# Patient Record
Sex: Female | Born: 1990 | Race: Black or African American | Hispanic: No | Marital: Single | Smoking: Never smoker
Health system: Southern US, Community
[De-identification: ages and names within clinical notes are randomized; demographics above are authoritative.]

## PROBLEM LIST (undated history)

## (undated) DIAGNOSIS — T7840XA Allergy, unspecified, initial encounter: Secondary | ICD-10-CM

## (undated) DIAGNOSIS — D649 Anemia, unspecified: Secondary | ICD-10-CM

## (undated) HISTORY — DX: Anemia, unspecified: D64.9

## (undated) HISTORY — DX: Allergy, unspecified, initial encounter: T78.40XA

---

## 2015-04-19 ENCOUNTER — Ambulatory Visit (INDEPENDENT_AMBULATORY_CARE_PROVIDER_SITE_OTHER): Payer: Managed Care, Other (non HMO) | Admitting: Family Medicine

## 2015-04-19 VITALS — BP 118/74 | HR 74 | Temp 98.4°F | Resp 14 | Ht 70.5 in | Wt 252.0 lb

## 2015-04-19 DIAGNOSIS — R42 Dizziness and giddiness: Secondary | ICD-10-CM | POA: Diagnosis not present

## 2015-04-19 DIAGNOSIS — Z3009 Encounter for other general counseling and advice on contraception: Secondary | ICD-10-CM | POA: Diagnosis not present

## 2015-04-19 DIAGNOSIS — G43111 Migraine with aura, intractable, with status migrainosus: Secondary | ICD-10-CM

## 2015-04-19 LAB — POCT CBC
GRANULOCYTE PERCENT: 49.3 % (ref 37–80)
HEMATOCRIT: 37 % — AB (ref 37.7–47.9)
Hemoglobin: 12.7 g/dL (ref 12.2–16.2)
Lymph, poc: 2.8 (ref 0.6–3.4)
MCH: 28.3 pg (ref 27–31.2)
MCHC: 34.3 g/dL (ref 31.8–35.4)
MCV: 82.6 fL (ref 80–97)
MID (cbc): 0.2 (ref 0–0.9)
MPV: 9.2 fL (ref 0–99.8)
PLATELET COUNT, POC: 240 10*3/uL (ref 142–424)
POC Granulocyte: 2.9 (ref 2–6.9)
POC LYMPH %: 47.1 % (ref 10–50)
POC MID %: 3.6 %M (ref 0–12)
RBC: 4.48 M/uL (ref 4.04–5.48)
RDW, POC: 13 %
WBC: 5.9 10*3/uL (ref 4.6–10.2)

## 2015-04-19 LAB — POCT URINALYSIS DIP (MANUAL ENTRY)
BILIRUBIN UA: NEGATIVE
BILIRUBIN UA: NEGATIVE
Blood, UA: NEGATIVE
Glucose, UA: NEGATIVE
LEUKOCYTES UA: NEGATIVE
Nitrite, UA: NEGATIVE
PH UA: 5.5
PROTEIN UA: NEGATIVE
SPEC GRAV UA: 1.02
Urobilinogen, UA: 0.2

## 2015-04-19 LAB — POC MICROSCOPIC URINALYSIS (UMFC)

## 2015-04-19 LAB — POCT URINE PREGNANCY: PREG TEST UR: NEGATIVE

## 2015-04-19 LAB — GLUCOSE, POCT (MANUAL RESULT ENTRY): POC Glucose: 114 mg/dl — AB (ref 70–99)

## 2015-04-19 MED ORDER — IBUPROFEN 800 MG PO TABS: 800.0000 mg | ORAL_TABLET | Freq: Three times a day (TID) | ORAL | Status: DC | PRN

## 2015-04-19 MED ORDER — AMITRIPTYLINE HCL 10 MG PO TABS: 10.0000 mg | ORAL_TABLET | Freq: Every day | ORAL | Status: DC

## 2015-04-19 MED ORDER — SUMATRIPTAN SUCCINATE 100 MG PO TABS: 100.0000 mg | ORAL_TABLET | ORAL | Status: DC | PRN

## 2015-04-19 NOTE — Patient Instructions (Addendum)
   IF you received an x-ray today, you will receive an invoice from [facility]. Please contact [facility] at [phone] with questions or concerns regarding your invoice.   IF you received labwork today, you will receive an invoice from [facility]. Please contact [facility] at [phone] with questions or concerns regarding your invoice.   Our billing staff will not be able to assist you with questions regarding bills from these companies.  You will be contacted with the lab results as soon as they are available. The fastest way to get your results is to activate your My Chart account. Instructions are located on the last page of this paperwork. If you have not heard from us regarding the results in 2 weeks, please contact this office.      Migraine Headache A migraine headache is an intense, throbbing pain on one or both sides of your head. A migraine can last for 30 minutes to several hours. CAUSES  The exact cause of a migraine headache is not always known. However, a migraine may be caused when nerves in the brain become irritated and release chemicals that cause inflammation. This causes pain. Certain things may also trigger migraines, such as:  Alcohol.  Smoking.  Stress.  Menstruation.  Aged cheeses.  Foods or drinks that contain nitrates, glutamate, aspartame, or tyramine.  Lack of sleep.  Chocolate.  Caffeine.  Hunger.  Physical exertion.  Fatigue.  Medicines used to treat chest pain (nitroglycerine), birth control pills, estrogen, and some blood pressure medicines. SIGNS AND SYMPTOMS  Pain on one or both sides of your head.  Pulsating or throbbing pain.  Severe pain that prevents daily activities.  Pain that is aggravated by any physical activity.  Nausea, vomiting, or both.  Dizziness.  Pain with exposure to bright lights, loud noises, or activity.  General sensitivity to bright lights, loud  noises, or smells. Before you get a migraine, you may get warning signs that a migraine is coming (aura). An aura may include:  Seeing flashing lights.  Seeing bright spots, halos, or zigzag lines.  Having tunnel vision or blurred vision.  Having feelings of numbness or tingling.  Having trouble talking.  Having muscle weakness. DIAGNOSIS  A migraine headache is often diagnosed based on:  Symptoms.  Physical exam.  A CT scan or MRI of your head. These imaging tests cannot diagnose migraines, but they can help rule out other causes of headaches. TREATMENT Medicines may be given for pain and nausea. Medicines can also be given to help prevent recurrent migraines.  HOME CARE INSTRUCTIONS  Only take over-the-counter or prescription medicines for pain or discomfort as directed by your health care provider. The use of long-term narcotics is not recommended.  Lie down in a dark, quiet room when you have a migraine.  Keep a journal to find out what may trigger your migraine headaches. For example, write down:  What you eat and drink.  How much sleep you get.  Any change to your diet or medicines.  Limit alcohol consumption.  Quit smoking if you smoke.  Get 7-9 hours of sleep, or as recommended by your health care provider.  Limit stress.  Keep lights dim if bright lights bother you and make your migraines worse. SEEK IMMEDIATE MEDICAL CARE IF:   Your migraine becomes severe.  You have a fever.  You have a stiff neck.  You have vision loss.  You have muscular weakness or loss of muscle control.  You   start losing your balance or have trouble walking.  You feel faint or pass out.  You have severe symptoms that are different from your first symptoms. MAKE SURE YOU:   Understand these instructions.  Will watch your condition.  Will get help right away if you are not doing well or get worse.   This information is not intended to replace advice given to you by  your health care provider. Make sure you discuss any questions you have with your health care provider.   Document Released: 12/27/2004 Document Revised: 01/17/2014 Document Reviewed: 09/03/2012 Elsevier Interactive Patient Education 2016 Elsevier Inc.  

## 2015-04-19 NOTE — Progress Notes (Signed)
Subjective:    Patient ID: Maria Contreras, female    DOB: 09/14/1990, 25 y.o.   MRN: [id number]  04/19/2015  Headache; Nausea; and Dizziness   HPI This 25 y.o. female presents for evaluation of headaches.  Suffers with a lot of headaches that come with nausea and dizziness.  Increase in frequency; onset two months ago. Last night got annoying. Excedrin extra strength without improvement. Tries to sleep off without improvement. Drinks plenty of water; does not eat badly a lot.  Tries to eat protein, fruits, vegetables.  No new foods.  L periorbital region; can be R periorbital.  +nauseated; +photophobia.  No phonophobia yet turn down music in car yesterday.  +diziness with headaches sometimes.  Does a lot of squatting at work; got really dizzy at work; suffering with headache at same time.  No vomiting.  +Brief blurred vision once.  No diplopia.  Does have a weird smell or bad taste in mouth before onset of headache.  No n/t/w.  No nighttime awakening.  Major stress lately but not as severe as in the past.  Sleeping well; no insomnia; 6-7 hours of sleep per night.  Schedule has changed at work; was working 6:00pm to 2:30am; now working 9:30pm to 4:00am.  Mother with migraines.  Did have headache today; was nauseated before arrival; now all resolved; no medications.  Having daily headaches for the past month. Last month, much less frequently.  Duration several hours.   LMP regular; LMP 04-02-15 regular; in February 1-5.  Trying to conceive.    PCP: none  Review of Systems  Constitutional: Negative for fever, chills, diaphoresis and fatigue.  HENT: Negative for congestion, ear pain, rhinorrhea and sore throat.   Eyes: Positive for photophobia. Negative for pain, discharge, itching and visual disturbance.  Respiratory: Negative for cough and shortness of breath.   Cardiovascular: Negative for chest pain, palpitations and leg swelling.  Gastrointestinal: Positive for nausea. Negative for vomiting,  abdominal pain, diarrhea and constipation.  Endocrine: Negative for cold intolerance, heat intolerance, polydipsia, polyphagia and polyuria.  Genitourinary: Positive for menstrual problem.  Neurological: Positive for dizziness and headaches. Negative for tremors, seizures, syncope, facial asymmetry, speech difficulty, weakness, light-headedness and numbness.  Psychiatric/Behavioral: Negative for sleep disturbance and dysphoric mood. The patient is not nervous/anxious.     Past Medical History  Diagnosis Date  . Anemia   . Allergy     Claritin   History reviewed. No pertinent past surgical history. No Known Allergies  Social History   Social History  . Marital Status: Single    Spouse Name: N/A  . Number of Children: N/A  . Years of Education: N/A   Occupational History  . Not on file.   Social History Main Topics  . Smoking status: Never Smoker   . Smokeless tobacco: Not on file  . Alcohol Use: Not on file  . Drug Use: Not on file  . Sexual Activity: Not on file   Other Topics Concern  . Not on file   Social History Narrative   Marital status: single      Children: none      Lives: alone      Employment: works for [employer]      Education: Manufacturing engineer in Engineer, manufacturing      Tobacco: none      Alcohol: 1 glass wine per week      Drugs: none      Exercise: crunches, walking dog at park  3 days per week.   Family History  Problem Relation Age of Onset  . Cancer Maternal Grandmother   . Diabetes Maternal Grandmother   . Hyperlipidemia Maternal Grandmother   . Hypertension Maternal Grandmother   . Migraines Mother   . Hypertension Mother        Objective:    BP 118/74 mmHg  Pulse 74  Temp(Src) 98.4 F (36.9 C) (Oral)  Resp 14  Ht 5' 10.5" (1.791 m)  Wt 252 lb (114.306 kg)  BMI 35.64 kg/m2  SpO2 98%  LMP 04/07/2015 Physical Exam  Constitutional: She is oriented to person, place, and time. She appears well-developed and  well-nourished. No distress.  HENT:  Head: Normocephalic and atraumatic.  Right Ear: External ear normal.  Left Ear: External ear normal.  Nose: Nose normal.  Mouth/Throat: Oropharynx is clear and moist.  Eyes: Conjunctivae and EOM are normal. Pupils are equal, round, and reactive to light.  Neck: Normal range of motion. Neck supple. Carotid bruit is not present. No thyromegaly present.  Cardiovascular: Normal rate, regular rhythm, normal heart sounds and intact distal pulses.  Exam reveals no gallop and no friction rub.   No murmur heard. Pulmonary/Chest: Effort normal and breath sounds normal. She has no wheezes. She has no rales.  Abdominal: Soft. Bowel sounds are normal. She exhibits no distension and no mass. There is no tenderness. There is no rebound and no guarding.  Lymphadenopathy:    She has no cervical adenopathy.  Neurological: She is alert and oriented to person, place, and time. She has normal reflexes. She displays no atrophy and no tremor. No cranial nerve deficit. She exhibits normal muscle tone. She displays a negative Romberg sign. Coordination and gait normal.  Skin: Skin is warm and dry. No rash noted. She is not diaphoretic. No erythema. No pallor.  Psychiatric: She has a normal mood and affect. Her behavior is normal. Judgment and thought content normal.   Results for orders placed or performed in visit on 04/19/15  POCT CBC  Result Value Ref Range   WBC 5.9 4.6 - 10.2 K/uL   Lymph, poc 2.8 0.6 - 3.4   POC LYMPH PERCENT 47.1 10 - 50 %L   MID (cbc) 0.2 0 - 0.9   POC MID % 3.6 0 - 12 %M   POC Granulocyte 2.9 2 - 6.9   Granulocyte percent 49.3 37 - 80 %G   RBC 4.48 4.04 - 5.48 M/uL   Hemoglobin 12.7 12.2 - 16.2 g/dL   HCT, POC 16.1 (A) 09.6 - 47.9 %   MCV 82.6 80 - 97 fL   MCH, POC 28.3 27 - 31.2 pg   MCHC 34.3 31.8 - 35.4 g/dL   RDW, POC 04.5 %   Platelet Count, POC 240 142 - 424 K/uL   MPV 9.2 0 - 99.8 fL  POCT glucose (manual entry)  Result Value Ref  Range   POC Glucose 114 (A) 70 - 99 mg/dl  POCT urinalysis dipstick  Result Value Ref Range   Color, UA yellow yellow   Clarity, UA clear clear   Glucose, UA negative negative   Bilirubin, UA negative negative   Ketones, POC UA negative negative   Spec Grav, UA 1.020    Blood, UA negative negative   pH, UA 5.5    Protein Ur, POC negative negative   Urobilinogen, UA 0.2    Nitrite, UA Negative Negative   Leukocytes, UA Negative Negative  POCT Microscopic Urinalysis ([facility])  Result  Value Ref Range   WBC,UR,HPF,POC None None WBC/hpf   RBC,UR,HPF,POC None None RBC/hpf   Bacteria Few (A) None, Too numerous to count   Mucus Present (A) Absent   Epithelial Cells, UR Per Microscopy Moderate (A) None, Too numerous to count cells/hpf  POCT urine pregnancy  Result Value Ref Range   Preg Test, Ur Negative Negative       Assessment & Plan:   1. Intractable migraine with aura with status migrainosus   2. Family planning counseling   3. Dizziness    -New. -Rx for Amitriptyline 10-20mg  qhs for prevention. (Class C) -rx for Imitrex and Ibuprofen 800mg  provided for rescue. (Class C meds) -when becomes pregnant, stop migraine medications and contact OB/GYN for further instructions. -PNV daily while attempting pregnancy. -normal neurological exam in office.   Orders Placed This Encounter  Procedures  . Comprehensive metabolic panel  . TSH  . Vitamin B12  . VITAMIN D 25 Hydroxy (Vit-D Deficiency, Fractures)  . POCT CBC  . POCT glucose (manual entry)  . POCT urinalysis dipstick  . POCT Microscopic Urinalysis ([facility])  . POCT urine pregnancy   Meds ordered this encounter  Medications  . SUMAtriptan (IMITREX) 100 MG tablet    Sig: Take 1 tablet (100 mg total) by mouth every 2 (two) hours as needed for migraine. May repeat in 2 hours if headache persists or recurs.    Dispense:  8 tablet    Refill:  4  . ibuprofen (ADVIL,MOTRIN) 800 MG tablet    Sig: Take 1 tablet (800 mg total) by  mouth every 8 (eight) hours as needed.    Dispense:  30 tablet    Refill:  3  . amitriptyline (ELAVIL) 10 MG tablet    Sig: Take 1-2 tablets (10-20 mg total) by mouth at bedtime.    Dispense:  60 tablet    Refill:  3    No Follow-up on file.    [place] Paulita FujitaMartin Kateleen Encarnacion, M.D. [facility]  [place] [address] [place], [place]  [id number] [phone] phone [phone] fax

## 2015-04-20 ENCOUNTER — Encounter (HOSPITAL_BASED_OUTPATIENT_CLINIC_OR_DEPARTMENT_OTHER): Payer: Self-pay | Admitting: *Deleted

## 2015-04-20 ENCOUNTER — Emergency Department (HOSPITAL_BASED_OUTPATIENT_CLINIC_OR_DEPARTMENT_OTHER)
Admission: EM | Admit: 2015-04-20 | Discharge: 2015-04-20 | Disposition: A | Discharge status: Home / Self Care | Payer: No Typology Code available for payment source | Attending: Emergency Medicine | Admitting: Emergency Medicine

## 2015-04-20 ENCOUNTER — Emergency Department (HOSPITAL_BASED_OUTPATIENT_CLINIC_OR_DEPARTMENT_OTHER): Payer: No Typology Code available for payment source

## 2015-04-20 DIAGNOSIS — S29002A Unspecified injury of muscle and tendon of back wall of thorax, initial encounter: Secondary | ICD-10-CM | POA: Diagnosis present

## 2015-04-20 DIAGNOSIS — Y92410 Unspecified street and highway as the place of occurrence of the external cause: Secondary | ICD-10-CM | POA: Insufficient documentation

## 2015-04-20 DIAGNOSIS — Z862 Personal history of diseases of the blood and blood-forming organs and certain disorders involving the immune mechanism: Secondary | ICD-10-CM | POA: Insufficient documentation

## 2015-04-20 DIAGNOSIS — S4991XA Unspecified injury of right shoulder and upper arm, initial encounter: Secondary | ICD-10-CM | POA: Insufficient documentation

## 2015-04-20 DIAGNOSIS — S4992XA Unspecified injury of left shoulder and upper arm, initial encounter: Secondary | ICD-10-CM | POA: Diagnosis not present

## 2015-04-20 DIAGNOSIS — Y9389 Activity, other specified: Secondary | ICD-10-CM | POA: Insufficient documentation

## 2015-04-20 DIAGNOSIS — M549 Dorsalgia, unspecified: Secondary | ICD-10-CM

## 2015-04-20 DIAGNOSIS — Y998 Other external cause status: Secondary | ICD-10-CM | POA: Diagnosis not present

## 2015-04-20 LAB — COMPREHENSIVE METABOLIC PANEL
ALBUMIN: 4.3 g/dL (ref 3.6–5.1)
ALK PHOS: 47 U/L (ref 33–115)
ALT: 15 U/L (ref 6–29)
AST: 13 U/L (ref 10–30)
BILIRUBIN TOTAL: 0.5 mg/dL (ref 0.2–1.2)
BUN: 9 mg/dL (ref 7–25)
CALCIUM: 9.7 mg/dL (ref 8.6–10.2)
CO2: 24 mmol/L (ref 20–31)
Chloride: 102 mmol/L (ref 98–110)
Creat: 0.66 mg/dL (ref 0.50–1.10)
GLUCOSE: 101 mg/dL — AB (ref 65–99)
POTASSIUM: 4.4 mmol/L (ref 3.5–5.3)
Sodium: 135 mmol/L (ref 135–146)
TOTAL PROTEIN: 7.7 g/dL (ref 6.1–8.1)

## 2015-04-20 LAB — TSH: TSH: 1.36 m[IU]/L

## 2015-04-20 LAB — VITAMIN B12: VITAMIN B 12: 636 pg/mL (ref 200–1100)

## 2015-04-20 MED ORDER — NAPROXEN 250 MG PO TABS
500.0000 mg | ORAL_TABLET | Freq: Once | ORAL | Status: AC
  Administered 2015-04-20: 500 mg via ORAL
  Filled 2015-04-20: qty 2

## 2015-04-20 MED ORDER — NAPROXEN 500 MG PO TABS: 500.0000 mg | ORAL_TABLET | Freq: Two times a day (BID) | ORAL | Status: DC | PRN

## 2015-04-20 MED ORDER — METHOCARBAMOL 500 MG PO TABS: 500.0000 mg | ORAL_TABLET | Freq: Two times a day (BID) | ORAL | Status: DC | PRN

## 2015-04-20 MED FILL — NAPROXEN 500 MG TABLET: 500 | 10 days supply | Qty: 20 | Fill #0

## 2015-04-20 MED FILL — METHOCARBAMOL 500 MG TABLET: 500 | 2 days supply | Qty: 4 | Fill #0

## 2015-04-20 NOTE — ED Notes (Signed)
Pt directed to pharmacy to pick up medications. Ambulatory with steady gait at d/c

## 2015-04-20 NOTE — ED Notes (Signed)
PA at bedside.
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
## 2015-04-20 NOTE — ED Notes (Signed)
Patient transported to X-ray and returned 
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
## 2015-04-20 NOTE — ED Notes (Signed)
MD at bedside. 
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
## 2015-04-21 LAB — VITAMIN D 25 HYDROXY (VIT D DEFICIENCY, FRACTURES): VIT D 25 HYDROXY: 24 ng/mL — AB (ref 30–100)

## 2015-04-23 ENCOUNTER — Encounter: Payer: Self-pay | Admitting: Family Medicine

## 2015-06-02 ENCOUNTER — Encounter (HOSPITAL_BASED_OUTPATIENT_CLINIC_OR_DEPARTMENT_OTHER): Payer: Self-pay | Admitting: Emergency Medicine

## 2015-06-02 ENCOUNTER — Emergency Department (HOSPITAL_BASED_OUTPATIENT_CLINIC_OR_DEPARTMENT_OTHER)
Admission: EM | Admit: 2015-06-02 | Discharge: 2015-06-02 | Disposition: A | Discharge status: Home / Self Care | Payer: No Typology Code available for payment source | Attending: Emergency Medicine | Admitting: Emergency Medicine

## 2015-06-02 DIAGNOSIS — G44209 Tension-type headache, unspecified, not intractable: Secondary | ICD-10-CM | POA: Insufficient documentation

## 2015-06-02 DIAGNOSIS — M542 Cervicalgia: Secondary | ICD-10-CM | POA: Diagnosis present

## 2015-06-02 MED ORDER — ACETAMINOPHEN 500 MG PO TABS: 1000.0000 mg | ORAL_TABLET | Freq: Four times a day (QID) | ORAL | Status: AC
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
## 2015-06-02 NOTE — Discharge Instructions (Signed)
Tension Headache A tension headache is a feeling of pain, pressure, or aching that is often felt over the front and sides of the head. The pain can be dull, or it can feel tight (constricting). Tension headaches are not normally associated with nausea or vomiting, and they do not get worse with physical activity. Tension headaches can last from 30 minutes to several days. This is the most common type of headache. CAUSES The exact cause of this condition is not known. Tension headaches often begin after stress, anxiety, or depression. Other triggers may include:  Alcohol.  Too much caffeine, or caffeine withdrawal.  Respiratory infections, such as colds, flu, or sinus infections.  Dental problems or teeth clenching.  Fatigue.  Holding your head and neck in the same position for a long period of time, such as while using a computer.  Smoking. SYMPTOMS Symptoms of this condition include:  A feeling of pressure around the head.  Dull, aching head pain.  Pain felt over the front and sides of the head.  Tenderness in the muscles of the head, neck, and shoulders. DIAGNOSIS This condition may be diagnosed based on your symptoms and a physical exam. Tests may be done, such as a CT scan or an MRI of your head. These tests may be done if your symptoms are severe or unusual. TREATMENT This condition may be treated with lifestyle changes and medicines to help relieve symptoms. HOME CARE INSTRUCTIONS Managing Pain  Take over-the-counter and prescription medicines only as told by your health care provider.  Lie down in a dark, quiet room when you have a headache.  If directed, apply ice to the head and neck area:  Put ice in a plastic bag.  Place a towel between your skin and the bag.  Leave the ice on for 20 minutes, 2-3 times per day.  Use a heating pad or a hot shower to apply heat to the head and neck area as told by your health care provider. Eating and Drinking  Eat meals on  a regular schedule.  Limit alcohol use.  Decrease your caffeine intake, or stop using caffeine. General Instructions  Keep all follow-up visits as told by your health care provider. This is important.  Keep a headache journal to help find out what may trigger your headaches. For example, write down:  What you eat and drink.  How much sleep you get.  Any change to your diet or medicines.  Try massage or other relaxation techniques.  Limit stress.  Sit up straight, and avoid tensing your muscles.  Do not use tobacco products, including cigarettes, chewing tobacco, or e-cigarettes. If you need help quitting, ask your health care provider.  Exercise regularly as told by your health care provider.  Get 7-9 hours of sleep, or the amount recommended by your health care provider. SEEK MEDICAL CARE IF:  Your symptoms are not helped by medicine.  You have a headache that is different from what you normally experience.  You have nausea or you vomit.  You have a fever. SEEK IMMEDIATE MEDICAL CARE IF:  Your headache becomes severe.  You have repeated vomiting.  You have a stiff neck.  You have a loss of vision.  You have problems with speech.  You have pain in your eye or ear.  You have muscular weakness or loss of muscle control.  You lose your balance or you have trouble walking.  You feel faint or you pass out.  You have confusion.     This information is not intended to replace advice given to you by your health care provider. Make sure you discuss any questions you have with your health care provider.   Document Released: 12/27/2004 Document Revised: 09/17/2014 Document Reviewed: 04/21/2014 Elsevier Interactive Patient Education 2016 [employer]  

## 2015-09-08 ENCOUNTER — Encounter (HOSPITAL_BASED_OUTPATIENT_CLINIC_OR_DEPARTMENT_OTHER): Payer: Self-pay | Admitting: *Deleted

## 2015-09-08 ENCOUNTER — Emergency Department (HOSPITAL_BASED_OUTPATIENT_CLINIC_OR_DEPARTMENT_OTHER)
Admission: EM | Admit: 2015-09-08 | Discharge: 2015-09-08 | Disposition: A | Discharge status: Home / Self Care | Payer: Managed Care, Other (non HMO) | Attending: Emergency Medicine | Admitting: Emergency Medicine

## 2015-09-08 DIAGNOSIS — H5789 Other specified disorders of eye and adnexa: Secondary | ICD-10-CM

## 2015-09-08 DIAGNOSIS — H578 Other specified disorders of eye and adnexa: Secondary | ICD-10-CM | POA: Insufficient documentation

## 2015-09-08 MED ORDER — TETRACAINE HCL 0.5 % OP SOLN
2.0000 [drp] | Freq: Once | OPHTHALMIC | Status: AC
  Administered 2015-09-08: 2 [drp] via OPHTHALMIC
  Filled 2015-09-08: qty 4

## 2015-09-08 MED ORDER — FLUORESCEIN SODIUM 1 MG OP STRP
1.0000 | ORAL_STRIP | Freq: Once | OPHTHALMIC | Status: AC
  Administered 2015-09-08: 1 via OPHTHALMIC
  Filled 2015-09-08: qty 1

## 2015-09-08 NOTE — ED Provider Notes (Signed)
[facility]   CSN: [id number] Arrival date & time: 09/08/15  2038 By signing my name below, I, Bridgette HabermannMaria Tan, attest that this documentation has been prepared under the direction and in the presence of Lyndal Pulleyaniel Prinston Kynard, MD. Electronically Signed: Bridgette HabermannMaria Tan, ED Scribe. 09/08/15. 9:30 PM.  History   Chief Complaint Chief Complaint  Patient presents with  . Eye Problem   HPI Comments: Maria Contreras is a 25 y.o. female who presents to the Emergency Department complaining of sudden onset, constant, 6/10 left eye pain onset one day ago. Pt notes that she wears contacts and thinks she may have scratched her left cornea with it. No alleviating factors tried PTA. She states she scratched her right eye with her contact before and notes that this feels the same. Pt has no other complaints at this time. She appears to be in no acute distress.   The history is provided by the patient. No language interpreter was used.    Past Medical History:  Diagnosis Date  . Allergy    Claritin  . Anemia     There are no active problems to display for this patient.   History reviewed. No pertinent surgical history.  OB History    No data available       Home Medications    Prior to Admission medications   Medication Sig Start Date End Date Taking? Authorizing Provider  acetaminophen (TYLENOL) 500 MG tablet Take 2 tablets (1,000 mg total) by mouth every 6 (six) hours. 06/02/15   Selina CooleyKyle Flores, MD    Family History Family History  Problem Relation Age of Onset  . Cancer Maternal Grandmother   . Diabetes Maternal Grandmother   . Hyperlipidemia Maternal Grandmother   . Hypertension Maternal Grandmother   . Migraines Mother   . Hypertension Mother     Social History Social History  Substance Use Topics  . Smoking status: Never Smoker  . Smokeless tobacco: Never Used  . Alcohol use Not on file     Allergies   Review of patient's allergies indicates no known  allergies.   Review of Systems Review of Systems  Constitutional: Negative for fever.  Eyes: Positive for pain.  All other systems reviewed and are negative.  Physical Exam Updated Vital Signs BP 135/80   Pulse 64   Temp 98 F (36.7 C) (Oral)   Resp 18   Ht 5\' 10"  (1.778 m)   Wt 262 lb (118.8 kg)   LMP 06/25/2015 Comment: home preg test are negative  SpO2 100%   BMI 37.59 kg/m   Physical Exam  Constitutional: She appears well-developed and well-nourished.  HENT:  Head: Normocephalic.  Eyes: Conjunctivae are normal. Pupils are equal, round, and reactive to light.  Slit lamp exam:      The left eye shows no fluorescein uptake.  No corneal abrasion on fluorescein. Left intraocular pressure: 24, 20, 19.  Cardiovascular: Normal rate.   Pulmonary/Chest: Effort normal. No respiratory distress.  Abdominal: She exhibits no distension.  Musculoskeletal: Normal range of motion.  Neurological: She is alert.  Skin: Skin is warm and dry.  Psychiatric: She has a normal mood and affect. Her behavior is normal.  Nursing note and vitals reviewed.  ED Treatments / Results  DIAGNOSTIC STUDIES: Oxygen Saturation is 100% on RA, normal by my interpretation.    COORDINATION OF CARE: 9:30 PM Discussed treatment plan with pt at bedside and pt agreed to plan.  Labs (all labs ordered are listed,  but only abnormal results are displayed) Labs Reviewed - No data to display  EKG  EKG Interpretation None       Radiology No results found.  Procedures Procedures (including critical care time)  Medications Ordered in ED Medications - No data to display   Initial Impression / Assessment and Plan / ED Course  I have reviewed the triage vital signs and the nursing notes.  Pertinent labs & imaging results that were available during my care of the patient were reviewed by me and considered in my medical decision making (see chart for details).  Clinical Course    25 y.o. female  presents with foreign body sensation in left eye with pain. Pt wears contacts and says feels similar to corneal abrasion. No abrasion noted, no foreign body noted, no signs of glaucoma or other emergent etiology. Recommended discontinuation of contact lenses and supportive care measures pending ophtho f/u. Plan to follow up with PCP as needed and return precautions discussed for worsening or new concerning symptoms.   Final Clinical Impressions(s) / ED Diagnoses   Final diagnoses:  Eye irritation    New Prescriptions New Prescriptions   No medications on file  I personally performed the services described in this documentation, which was scribed in my presence. The recorded information has been reviewed and is accurate.       Lyndal Pulley, MD 09/09/15 [phone]

## 2015-09-08 NOTE — ED Triage Notes (Signed)
She may have scratched her left cornea with her contact. Pain and tearing.

## 2015-12-15 ENCOUNTER — Emergency Department (HOSPITAL_BASED_OUTPATIENT_CLINIC_OR_DEPARTMENT_OTHER)
Admission: EM | Admit: 2015-12-15 | Discharge: 2015-12-15 | Disposition: A | Discharge status: Home / Self Care | Payer: Managed Care, Other (non HMO) | Attending: Emergency Medicine | Admitting: Emergency Medicine

## 2015-12-15 ENCOUNTER — Encounter (HOSPITAL_BASED_OUTPATIENT_CLINIC_OR_DEPARTMENT_OTHER): Payer: Self-pay

## 2015-12-15 DIAGNOSIS — Y929 Unspecified place or not applicable: Secondary | ICD-10-CM | POA: Diagnosis not present

## 2015-12-15 DIAGNOSIS — Y939 Activity, unspecified: Secondary | ICD-10-CM | POA: Insufficient documentation

## 2015-12-15 DIAGNOSIS — Y999 Unspecified external cause status: Secondary | ICD-10-CM | POA: Diagnosis not present

## 2015-12-15 DIAGNOSIS — S00501A Unspecified superficial injury of lip, initial encounter: Secondary | ICD-10-CM | POA: Diagnosis present

## 2015-12-15 DIAGNOSIS — X58XXXA Exposure to other specified factors, initial encounter: Secondary | ICD-10-CM | POA: Diagnosis not present

## 2015-12-15 DIAGNOSIS — S01501A Unspecified open wound of lip, initial encounter: Secondary | ICD-10-CM

## 2015-12-15 DIAGNOSIS — S01531A Puncture wound without foreign body of lip, initial encounter: Secondary | ICD-10-CM | POA: Diagnosis not present

## 2015-12-15 MED ORDER — SILVER NITRATE-POT NITRATE 75-25 % EX MISC
CUTANEOUS | Status: AC
  Filled 2015-12-15: qty 1

## 2015-12-15 NOTE — ED Triage Notes (Signed)
Pt states she had a bump on her top lip since thanksgiving and tonight she picked at it and it won't stop bleeding now

## 2015-12-15 NOTE — ED Notes (Signed)
Pinpoint sized wound to upper lip from patient picking at a bump.  Put pressure on wound with sterile gauze until pt sees MD.

## 2015-12-15 NOTE — Discharge Instructions (Signed)
You were seen today for bleeding of the lip. This was cauterized with silver nitrate. Keep lips moist with Vaseline ointment. Try not to aggravate cauterized site.

## 2015-12-15 NOTE — ED Provider Notes (Signed)
[facility]   CSN: [id number] Arrival date & time: 12/15/15  0345     History   Chief Complaint Chief Complaint  Patient presents with  . Mouth Injury    HPI Maria Contreras is a 25 y.o. female.  HPI  This is a 25 year old female who presents with bleeding from her lip. Patient reports that she had a bump on her lip that came up during the week of Thanksgiving. She reports that today she scratched at the bottom and it has been bleeding for the last 4 hours. It has not gotten any better with pressure. She did not notice any purulence from the wound. She states that the bump was clear and "I thought I had a fever blister." Denies pain.  Past Medical History:  Diagnosis Date  . Allergy    Claritin  . Anemia     There are no active problems to display for this patient.   History reviewed. No pertinent surgical history.  OB History    No data available       Home Medications    Prior to Admission medications   Medication Sig Start Date End Date Taking? Authorizing Provider  acetaminophen (TYLENOL) 500 MG tablet Take 2 tablets (1,000 mg total) by mouth every 6 (six) hours. 06/02/15   Selina CooleyKyle Flores, MD    Family History Family History  Problem Relation Age of Onset  . Cancer Maternal Grandmother   . Diabetes Maternal Grandmother   . Hyperlipidemia Maternal Grandmother   . Hypertension Maternal Grandmother   . Migraines Mother   . Hypertension Mother     Social History Social History  Substance Use Topics  . Smoking status: Never Smoker  . Smokeless tobacco: Never Used  . Alcohol use Not on file     Allergies   Patient has no known allergies.   Review of Systems Review of Systems  HENT:       Lip bleeding  Skin: Positive for wound.  All other systems reviewed and are negative.    Physical Exam Updated Vital Signs BP 136/94 (BP Location: Right Arm)   Pulse 72   Temp 98.6 F (37 C) (Oral)   Resp 18   Ht 5\' 10"   (1.778 m)   Wt 260 lb (117.9 kg)   LMP 12/03/2015   SpO2 100%   BMI 37.31 kg/m   Physical Exam  Constitutional: She is oriented to person, place, and time. She appears well-developed and well-nourished.  HENT:  Head: Normocephalic and atraumatic.  Punctate oozing noted from left upper lip inner mucosa, no induration or fluctuance, no adjacent skin changes, no blistering noted  Cardiovascular: Normal rate and regular rhythm.   Pulmonary/Chest: Effort normal. No respiratory distress.  Neurological: She is alert and oriented to person, place, and time.  Skin: Skin is warm and dry.  Psychiatric: She has a normal mood and affect.  Nursing note and vitals reviewed.    ED Treatments / Results  Labs (all labs ordered are listed, but only abnormal results are displayed) Labs Reviewed - No data to display  EKG  EKG Interpretation None       Radiology No results found.  Procedures Procedures (including critical care time)  Medications Ordered in ED Medications  silver nitrate applicators 75-25 % applicator (not administered)     Initial Impression / Assessment and Plan / ED Course  I have reviewed the triage vital signs and the nursing notes.  Pertinent labs & imaging  results that were available during my care of the patient were reviewed by me and considered in my medical decision making (see chart for details).  Clinical Course     Patient presents with oozing from the upper lip. There is a punctate area of oozing that is refractory to pressure. This is on the inner mucosal surface of the upper lobe. This was cauterized with silver nitrate with good bleeding control. Lips and general appeared dry. Apply vaseline.  After history, exam, and medical workup I feel the patient has been appropriately medically screened and is safe for discharge home. Pertinent diagnoses were discussed with the patient. Patient was given return precautions.   Final Clinical Impressions(s) /  ED Diagnoses   Final diagnoses:  Open wound of lip, unspecified open wound type, initial encounter    New Prescriptions New Prescriptions   No medications on file     Shon Batonourtney F Ozzie Knobel, MD 12/15/15 [phone]

## 2015-12-15 NOTE — ED Notes (Signed)
Pt verbalizes understanding of d/c instructions and denies any further needs at this time. 

## 2015-12-31 ENCOUNTER — Emergency Department (HOSPITAL_BASED_OUTPATIENT_CLINIC_OR_DEPARTMENT_OTHER)
Admission: EM | Admit: 2015-12-31 | Discharge: 2015-12-31 | Disposition: A | Discharge status: Home / Self Care | Payer: Managed Care, Other (non HMO) | Attending: Physician Assistant | Admitting: Physician Assistant

## 2015-12-31 ENCOUNTER — Encounter (HOSPITAL_BASED_OUTPATIENT_CLINIC_OR_DEPARTMENT_OTHER): Payer: Self-pay | Admitting: *Deleted

## 2015-12-31 DIAGNOSIS — Y999 Unspecified external cause status: Secondary | ICD-10-CM | POA: Diagnosis not present

## 2015-12-31 DIAGNOSIS — M25512 Pain in left shoulder: Secondary | ICD-10-CM | POA: Insufficient documentation

## 2015-12-31 DIAGNOSIS — Y939 Activity, unspecified: Secondary | ICD-10-CM | POA: Diagnosis not present

## 2015-12-31 DIAGNOSIS — M25511 Pain in right shoulder: Secondary | ICD-10-CM | POA: Diagnosis not present

## 2015-12-31 DIAGNOSIS — S4991XA Unspecified injury of right shoulder and upper arm, initial encounter: Secondary | ICD-10-CM | POA: Diagnosis present

## 2015-12-31 DIAGNOSIS — Y92410 Unspecified street and highway as the place of occurrence of the external cause: Secondary | ICD-10-CM | POA: Insufficient documentation

## 2015-12-31 MED ORDER — IBUPROFEN 800 MG PO TABS: 800.0000 mg | ORAL_TABLET | Freq: Three times a day (TID) | ORAL | 0 refills | Status: AC

## 2015-12-31 MED ORDER — CYCLOBENZAPRINE HCL 10 MG PO TABS
10.0000 mg | ORAL_TABLET | Freq: Two times a day (BID) | ORAL | 0 refills | Status: AC | PRN
Start: 2015-12-31 — End: ?

## 2015-12-31 MED FILL — IBUPROFEN 800 MG TABLET: 800 | 7 days supply | Qty: 21 | Fill #0

## 2015-12-31 MED FILL — CYCLOBENZAPRINE 10 MG TAB: 10 | 5 days supply | Qty: 10 | Fill #0
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
